# Patient Record
Sex: Female | Born: 1965 | Race: White | Hispanic: No | Marital: Married | Smoking: Never smoker
Health system: Southern US, Community
[De-identification: ages and names within clinical notes are randomized; demographics above are authoritative.]

## PROBLEM LIST (undated history)

## (undated) HISTORY — PX: ENDOMETRIAL ABLATION: SHX621

## (undated) HISTORY — PX: TUBAL LIGATION: SHX77

---

## 1999-01-19 ENCOUNTER — Inpatient Hospital Stay (HOSPITAL_COMMUNITY): Admission: AD | Admit: 1999-01-19 | Discharge: 1999-01-19 | Payer: Self-pay | Admitting: *Deleted

## 1999-05-28 ENCOUNTER — Other Ambulatory Visit: Admission: RE | Admit: 1999-05-28 | Discharge: 1999-05-28 | Payer: Self-pay | Admitting: Obstetrics and Gynecology

## 2007-06-26 ENCOUNTER — Other Ambulatory Visit: Admission: RE | Admit: 2007-06-26 | Discharge: 2007-06-26 | Payer: Self-pay | Admitting: Obstetrics & Gynecology

## 2007-08-31 ENCOUNTER — Ambulatory Visit (HOSPITAL_BASED_OUTPATIENT_CLINIC_OR_DEPARTMENT_OTHER): Admission: RE | Admit: 2007-08-31 | Discharge: 2007-08-31 | Payer: Self-pay | Admitting: Obstetrics & Gynecology

## 2008-05-03 ENCOUNTER — Ambulatory Visit (HOSPITAL_COMMUNITY): Admission: RE | Admit: 2008-05-03 | Discharge: 2008-05-03 | Payer: Self-pay | Admitting: Obstetrics & Gynecology

## 2010-08-23 ENCOUNTER — Other Ambulatory Visit: Payer: Self-pay | Admitting: Obstetrics & Gynecology

## 2010-08-23 DIAGNOSIS — Z1231 Encounter for screening mammogram for malignant neoplasm of breast: Secondary | ICD-10-CM

## 2010-09-11 ENCOUNTER — Ambulatory Visit (HOSPITAL_COMMUNITY)
Admission: RE | Admit: 2010-09-11 | Discharge: 2010-09-11 | Disposition: A | Discharge status: Home / Self Care | Payer: BC Managed Care – PPO | Source: Ambulatory Visit | Attending: Obstetrics & Gynecology | Admitting: Obstetrics & Gynecology

## 2010-09-11 DIAGNOSIS — Z1231 Encounter for screening mammogram for malignant neoplasm of breast: Secondary | ICD-10-CM

## 2010-11-06 NOTE — Op Note (Signed)
Dawn Sims              ACCOUNT NO.:  [id number]   MEDICAL RECORD NO.:  [id number]          PATIENT TYPE:  AMB   LOCATION:  [facility]                         FACILITY:  [facility]   PHYSICIAN:  M. Leda Quail, MD  DATE OF BIRTH:  [birth year]   DATE OF PROCEDURE:  08/31/2007  DATE OF DISCHARGE:                               OPERATIVE REPORT   PREOPERATIVE DIAGNOSES:  69. 45 year old G2, P1, ectopic 1 white female with menorrhagia.  2. Endometrial polyps noted on sonohysterogram.  3. Undesired fertility.   POSTOPERATIVE DIAGNOSES:  52. 45 year old G2, P1, ectopic 1 white female with menorrhagia.  2. Endometrial polyps noted on sonohysterogram.  3. Undesired fertility.  4. The pretreatment with Lupron cause regression of the small      endometrial polyps that had been seen on sonohysterogram.   PROCEDURE:  Hydro thermal ablation and laparoscopic bilateral tubal  ligation with Filshie clips.   SURGEON:  M. Leda Quail, MD.   ASSISTANT:  OR staff.   ANESTHESIA:  General endotracheal, Dr. Rica Mast was the anesthesiologist  overseeing the case.   FINDINGS:  1. Thin-appearing endometrium noted with hysteroscopy.  2. Normal uterus, tubes and ovaries as well as normal-appearing liver,      falciform ligament and stomach edge.   SPECIMENS:  None.   ESTIMATED BLOOD LOSS:  Minimal.   FLUIDS:  1000 mL of LR.   URINE OUTPUT:  100 mL of clear urine drained with Foley catheter twice  during the procedure.   COMPLICATIONS:  None.   INDICATIONS:  [name] is a very nice 45 year old G2 P1, ectopic 1  white female who has a history of menorrhagia.  She had been evaluated  with sonohysterogram which showed several small endometrial polyps and  an endometrial biopsy.  The endometrial biopsy was done in January and  showed benign secretory endometrium.  She was pretreated with Depo-  Lupron.  The risk and benefits have been explained in the office and she  is ready to  proceed.   PROCEDURE:  The patient is taken the to operating room.  She is placed  in supine position.  General endotracheal anesthesia was administered by  the anesthesia staff without difficulty.  Legs positioned in [place]  stirrups initially in the high lithotomy position.  Abdomen, perineum,  inner thighs and vagina prepped and draped in normal sterile fashion.  Red rubber Foley catheter was used to drain the bladder of all urine at  this point and the Foley catheter was kept sterile.  Bivalve speculum  was placed in the vagina.  The anterior lip cervix grasped with single-  tooth tenaculum.  Uterus sounds to about 6.5 cm.  Using [employer] dilators,  the cervix was dilated up to #21.  5 mm hysteroscope with the HTA  apparatus attached is obtained.  An attempt was made to pass this  through the endocervical canal.  This was not successful.  Then the  cervix was dilated up to a #23.  At this point the scope with the HTA  apparatus attached could be slowly passed through the endocervical canal  into the  endometrial cavity.  Photo documentation of endometrial cavity  and fallopian tube ostia are noted.  The endometrial is thin and there  was no apparent endometrial polyps.  At this point the scope was placed  in toward the lower uterine segment.  Then a flush of the system was  performed without difficulty.  There appeared to be a great seal around  the cervix.  Then the system filled and heating cycle was begun.  Once  the hysteroscopic fluid was heated to approximately 90 degrees Celsius,  a 10-minute ablation cycle was performed.  The reservoir was stable  around 80 mL.  During the entire procedure there was approximately a 2  to 3 mL loss which is consistent with expansion of the endometrial  cavity during the ablation.  Once a 10-minute ablation was complete, a  cooling cycle was performed and once the fluid was cool enough, the  scope was removed from the endometrial cavity.  Photo  documentation was  performed before this was done.  At this point the scope was removed.  A  acorn uterine manipulator was placed at the cervix and attached to the  tenaculum as a means of manipulating the uterus during the rest of the  procedure.  The bivalve speculum was removed at this point.  Legs are  positioned in the low lithotomy position and the bladder was then  drained of all urine again.   Sterile gown and gloves were changed by the operator and assistant.  Attention was turned to the umbilicus.  0.25% Marcaine was placed  beneath the umbilicus.  Approximately 2 mL of 0.25% Marcaine without  epinephrine was placed.  Of note a paracervical block with 1% lidocaine  was placed in the cervix before the beginning of any of the procedure  vaginally.  Using a #11 blade a 10 mm skin incision was made from the  umbilicus inferiorly.  Using hemostats, subcu fat tissue was dissected.  The fascia was identified using S retractors.  This was grasped with the  Kocher clamps and elevated.  Using curved Mayo scissors, this fascial  layer was incised.  Then Kelly clamp was used to dissect the  preperitoneal fat.  The posterior sheath had already been incised.  The  peritoneum was entered bluntly.  At this point a number 0 Vicryl was  obtained.  Figure-of-eight suture was placed on either side of the  fascial incision and then a Hasson was placed intra-abdominally.  The  Hasson was attached to the skin using the sutures.  Then CO2 gas was  attached to the Hasson and pneumoperitoneum was easily achieved.  Laparoscope was used to visualize the pelvis.  The patient was placed in  Trendelenburg.  The upper abdomen was surveyed with a normal-appearing  liver, stomach edge, falciform ligament.  Appendix could not be  visualized.  The uterus, ovaries, fallopian tubes appeared normal.  There was no scarring evident from the previous ectopic.  This had been  treated with methotrexate.  This point a  clip applier with Filshie clip  was obtained.  A Filshie clip was placed on the right and left fallopian  tube and the isthmic region without difficulty.  Photo documentation was  made of these clips and tubes bilaterally.  The tubes had been carried  out to the fimbriated end to ensure that the clips were placed on the  fallopian tubes and not round ligaments.  At this point the procedure  was ended.  The patient is placed  back in supine position.  The  laparoscope was removed.  The pneumoperitoneum was relieved.  A C.R.N.A.  gave the patient several deep breaths in an attempt to remove all air  from the abdomen.  The Hasson was then removed.  The operator's index  fingers placed in the incision to ensure no bowel was in place.  The  figure-of-eight sutures on each side of the incision were then tied  together to close the fascia.  At this point the incision was closed  with subcuticular stitch of 3-0 Vicryl.  Then a Dermabond was used to  make the skin incision air tight.  The Betadine was cleansed off the  skin.  The instruments from the vagina are removed.  There was only a  small amount bleeding from the tenaculum site on the cervix.  At this  point legs were positioned in the supine position.  They were lowered to  the  low lithotomy position for the laparoscopic portion of the procedure.  The patient was awakened from anesthesia and extubated without  difficulty.  Sponge, lap, needle and sponge counts were correct x2.  The  patient tolerated procedure well and she was taken to the recovery room  in stable condition.      Lum Keas, MD  Electronically Signed     MSM/MEDQ  D:  08/31/2007  T:  09/01/2007  Job:  [id number]

## 2011-03-18 LAB — URINALYSIS, ROUTINE W REFLEX MICROSCOPIC
Glucose, UA: NEGATIVE
Hgb urine dipstick: NEGATIVE
Nitrite: NEGATIVE
pH: 7

## 2011-03-18 LAB — CBC
Hemoglobin: 11.8 — ABNORMAL LOW
MCHC: 33.4
RBC: 4.46
RDW: 14.2

## 2011-10-07 ENCOUNTER — Other Ambulatory Visit: Payer: Self-pay | Admitting: Obstetrics & Gynecology

## 2011-10-07 DIAGNOSIS — Z1231 Encounter for screening mammogram for malignant neoplasm of breast: Secondary | ICD-10-CM

## 2011-11-07 ENCOUNTER — Ambulatory Visit (HOSPITAL_COMMUNITY)
Admission: RE | Admit: 2011-11-07 | Discharge: 2011-11-07 | Disposition: A | Discharge status: Home / Self Care | Payer: BC Managed Care – PPO | Source: Ambulatory Visit | Attending: Obstetrics & Gynecology | Admitting: Obstetrics & Gynecology

## 2011-11-07 DIAGNOSIS — Z1231 Encounter for screening mammogram for malignant neoplasm of breast: Secondary | ICD-10-CM | POA: Insufficient documentation

## 2011-11-12 ENCOUNTER — Other Ambulatory Visit: Payer: Self-pay | Admitting: Obstetrics & Gynecology

## 2011-11-12 DIAGNOSIS — R928 Other abnormal and inconclusive findings on diagnostic imaging of breast: Secondary | ICD-10-CM

## 2011-11-21 ENCOUNTER — Ambulatory Visit
Admission: RE | Admit: 2011-11-21 | Discharge: 2011-11-21 | Disposition: A | Discharge status: Home / Self Care | Payer: BC Managed Care – PPO | Source: Ambulatory Visit | Attending: Obstetrics & Gynecology | Admitting: Obstetrics & Gynecology

## 2011-11-21 DIAGNOSIS — R928 Other abnormal and inconclusive findings on diagnostic imaging of breast: Secondary | ICD-10-CM

## 2012-10-16 ENCOUNTER — Other Ambulatory Visit: Payer: Self-pay

## 2012-10-16 DIAGNOSIS — Z1231 Encounter for screening mammogram for malignant neoplasm of breast: Secondary | ICD-10-CM

## 2012-12-01 ENCOUNTER — Ambulatory Visit
Admission: RE | Admit: 2012-12-01 | Discharge: 2012-12-01 | Disposition: A | Discharge status: Home / Self Care | Payer: BC Managed Care – PPO | Source: Ambulatory Visit

## 2012-12-01 DIAGNOSIS — Z1231 Encounter for screening mammogram for malignant neoplasm of breast: Secondary | ICD-10-CM

## 2012-12-02 ENCOUNTER — Other Ambulatory Visit: Payer: Self-pay | Admitting: Obstetrics & Gynecology

## 2012-12-02 DIAGNOSIS — R928 Other abnormal and inconclusive findings on diagnostic imaging of breast: Secondary | ICD-10-CM

## 2012-12-07 ENCOUNTER — Ambulatory Visit
Admission: RE | Admit: 2012-12-07 | Discharge: 2012-12-07 | Disposition: A | Discharge status: Home / Self Care | Payer: BC Managed Care – PPO | Source: Ambulatory Visit | Attending: Obstetrics & Gynecology | Admitting: Obstetrics & Gynecology

## 2012-12-07 DIAGNOSIS — R928 Other abnormal and inconclusive findings on diagnostic imaging of breast: Secondary | ICD-10-CM

## 2013-03-19 ENCOUNTER — Encounter: Payer: Self-pay | Admitting: Obstetrics & Gynecology

## 2013-08-12 ENCOUNTER — Encounter: Payer: Self-pay | Admitting: Obstetrics & Gynecology

## 2013-08-13 ENCOUNTER — Encounter: Payer: Self-pay | Admitting: Obstetrics & Gynecology

## 2013-08-13 ENCOUNTER — Ambulatory Visit (INDEPENDENT_AMBULATORY_CARE_PROVIDER_SITE_OTHER): Payer: BC Managed Care – PPO | Admitting: Obstetrics & Gynecology

## 2013-08-13 VITALS — BP 118/80 | HR 64 | Resp 16 | Ht 61.0 in | Wt 126.0 lb

## 2013-08-13 DIAGNOSIS — Z0000 Encounter for general adult medical examination without abnormal findings: Secondary | ICD-10-CM

## 2013-08-13 DIAGNOSIS — D242 Benign neoplasm of left breast: Secondary | ICD-10-CM

## 2013-08-13 DIAGNOSIS — D249 Benign neoplasm of unspecified breast: Secondary | ICD-10-CM

## 2013-08-13 DIAGNOSIS — Z01419 Encounter for gynecological examination (general) (routine) without abnormal findings: Secondary | ICD-10-CM

## 2013-08-13 LAB — POCT URINALYSIS DIPSTICK
Bilirubin, UA: NEGATIVE
Blood, UA: NEGATIVE
Glucose, UA: NEGATIVE
Ketones, UA: NEGATIVE
Leukocytes, UA: NEGATIVE
Nitrite, UA: NEGATIVE
Protein, UA: NEGATIVE
Urobilinogen, UA: NEGATIVE
pH, UA: 6

## 2013-08-13 LAB — HEMOGLOBIN, FINGERSTICK: HEMOGLOBIN, FINGERSTICK: 14 g/dL (ref 12.0–16.0)

## 2013-08-13 NOTE — Addendum Note (Signed)
Addended by: Michele Mcalpine on: 08/13/2013 10:30 AM   Modules accepted: Orders

## 2013-08-13 NOTE — Progress Notes (Signed)
48 y.o. G1P1 SingleCaucasianF here for annual exam.  No vaginal bleeding in last year.  Really pleased with ablation.  No vasomotor symptoms.    Hasn't done follow up for MMG.  Will schedule.    Patient's last menstrual period was 04/24/2012.          Sexually active: yes  The current method of family planning is tubal ligation.    Exercising: no  not regularly Smoker:  no  Health Maintenance: Pap:  05/07/12 WNL/negative HR HPV History of abnormal Pap:  Yes age 82 h/o cryosurgery MMG:  12/01/12 3D mmg, 12/07/12 left breast US-patient aware 6 month [place] f/u past due Colonoscopy:  none BMD:   none TDaP:  11/13 Screening Labs: 11/13, Hb today: 14.0, Urine today: PH-6.0   reports that she has never smoked. She has never used smokeless tobacco. She reports that she does not drink alcohol or use illicit drugs.  History reviewed. No pertinent past medical history.  Past Surgical History  Procedure Laterality Date  . Tubal ligation    . Ablation      Current Outpatient Prescriptions  Medication Sig Dispense Refill  . CALCIUM PO Take by mouth daily.      . Omega-3 Fatty Acids (FISH OIL PO) Take by mouth daily.       No current facility-administered medications for this visit.    Family History  Problem Relation Age of Onset  . Bladder Cancer Father   . Deep vein thrombosis Mother     ROS:  Pertinent items are noted in HPI.  Otherwise, a comprehensive ROS was negative.  Exam:   BP 118/80  Pulse 64  Resp 16  Ht 5\' 1"  (1.549 m)  Wt 126 lb (57.153 kg)  BMI 23.82 kg/m2  LMP 04/24/2012  Weight change: +3lbs  Height: 5\' 1"  (154.9 cm)  Ht Readings from Last 3 Encounters:  08/13/13 5\' 1"  (1.549 m)    General appearance: alert, cooperative and appears stated age Head: Normocephalic, without obvious abnormality, atraumatic Neck: no adenopathy, supple, symmetrical, trachea midline and thyroid normal to inspection and palpation Lungs: clear to auscultation bilaterally Breasts:  normal appearance, no masses or tenderness Heart: regular rate and rhythm Abdomen: soft, non-tender; bowel sounds normal; no masses,  no organomegaly Extremities: extremities normal, atraumatic, no cyanosis or edema Skin: Skin color, texture, turgor normal. No rashes or lesions Lymph nodes: Cervical, supraclavicular, and axillary nodes normal. No abnormal inguinal nodes palpated Neurologic: Grossly normal   Pelvic: External genitalia:  no lesions              Urethra:  normal appearing urethra with no masses, tenderness or lesions              Bartholins and Skenes: normal                 Vagina: normal appearing vagina with normal color and discharge, no lesions              Cervix: no lesions              Pap taken: no Bimanual Exam:  Uterus:  normal size, contour, position, consistency, mobility, non-tender              Adnexa: normal adnexa and no mass, fullness, tenderness               Rectovaginal: Confirms               Anus:  normal sphincter tone, no  lesions  A:  Well Woman with normal exam Amenorrhea after endometrial ablation H/o abnormal MMG 6/14.  Didn't go for f/u.  P:   Left breast u/s to be scheduled. pap smear with neg HR HPV 11/3.  No pap today.  Labs 11/13.  No lab work today. return annually or prn  An After Visit Summary was printed and given to the patient.

## 2013-08-13 NOTE — Patient Instructions (Signed)

## 2013-08-13 NOTE — Progress Notes (Signed)
Patient scheduled for [facility] for 08/18/13 at 1615 for L Breast Ultrasound. Agreeable to time/date/location.

## 2013-08-18 ENCOUNTER — Other Ambulatory Visit: Payer: BC Managed Care – PPO

## 2013-08-24 ENCOUNTER — Telehealth: Payer: Self-pay | Admitting: Emergency Medicine

## 2013-08-24 NOTE — Telephone Encounter (Signed)
Patient has cancelled her breast u/s appointment as of 08/16/2013 9:37 AM.   Dawn Sims with patient. She is very thankful for phone call to follow up, but states that she will [facility] as she has had other family issues and weather changes have caused her to make changes to her schedule.   Routing to provider for final review. Patient agreeable to disposition. Will close encounter. Will continue to keep in Mammogram hold.

## 2013-09-16 ENCOUNTER — Telehealth: Payer: Self-pay | Admitting: Obstetrics & Gynecology

## 2013-09-16 NOTE — Telephone Encounter (Signed)
per patient, she is scheduled for annual mammogram in June. Will wait until then.

## 2013-10-01 NOTE — Telephone Encounter (Signed)
Dr. Sabra Heck, I have attempted to contact patient on 08/24/13 and the message was that she would reschedule.  Patient will be 6 months late for 6 month follow up if waits until June for L Breast US.   Do you want to send letter? Keep in hold?

## 2013-10-04 ENCOUNTER — Encounter: Payer: Self-pay | Admitting: Obstetrics & Gynecology

## 2013-10-04 NOTE — Telephone Encounter (Signed)
Letter written.  Will mail and then remove from hold and any recall that is present.

## 2013-10-07 NOTE — Telephone Encounter (Signed)
Late entry for 10/04/13: Letter mailed to patient. Will remove from hold and recall.

## 2013-10-28 ENCOUNTER — Other Ambulatory Visit: Payer: Self-pay | Admitting: Obstetrics & Gynecology

## 2013-10-28 DIAGNOSIS — N6320 Unspecified lump in the left breast, unspecified quadrant: Secondary | ICD-10-CM

## 2013-12-07 ENCOUNTER — Encounter (INDEPENDENT_AMBULATORY_CARE_PROVIDER_SITE_OTHER): Payer: Self-pay

## 2013-12-07 ENCOUNTER — Ambulatory Visit
Admission: RE | Admit: 2013-12-07 | Discharge: 2013-12-07 | Disposition: A | Discharge status: Home / Self Care | Payer: BC Managed Care – PPO | Source: Ambulatory Visit | Attending: Obstetrics & Gynecology | Admitting: Obstetrics & Gynecology

## 2013-12-07 DIAGNOSIS — N6320 Unspecified lump in the left breast, unspecified quadrant: Secondary | ICD-10-CM

## 2013-12-07 DIAGNOSIS — D242 Benign neoplasm of left breast: Secondary | ICD-10-CM

## 2014-04-25 ENCOUNTER — Encounter: Payer: Self-pay | Admitting: Obstetrics & Gynecology

## 2014-10-27 ENCOUNTER — Ambulatory Visit (INDEPENDENT_AMBULATORY_CARE_PROVIDER_SITE_OTHER): Payer: BLUE CROSS/BLUE SHIELD | Admitting: Obstetrics & Gynecology

## 2014-10-27 ENCOUNTER — Encounter: Payer: Self-pay | Admitting: Obstetrics & Gynecology

## 2014-10-27 VITALS — BP 120/72 | HR 68 | Resp 16 | Ht 61.0 in | Wt 129.0 lb

## 2014-10-27 DIAGNOSIS — Z01419 Encounter for gynecological examination (general) (routine) without abnormal findings: Secondary | ICD-10-CM | POA: Diagnosis not present

## 2014-10-27 DIAGNOSIS — Z124 Encounter for screening for malignant neoplasm of cervix: Secondary | ICD-10-CM

## 2014-10-27 DIAGNOSIS — Z0000 Encounter for general adult medical examination without abnormal findings: Secondary | ICD-10-CM | POA: Diagnosis not present

## 2014-10-27 LAB — POCT URINALYSIS DIPSTICK
BILIRUBIN UA: NEGATIVE
GLUCOSE UA: NEGATIVE
Ketones, UA: NEGATIVE
Leukocytes, UA: NEGATIVE
NITRITE UA: NEGATIVE
PROTEIN UA: NEGATIVE
RBC UA: NEGATIVE
UROBILINOGEN UA: NEGATIVE
pH, UA: 5

## 2014-10-27 LAB — LIPID PANEL
Cholesterol: 202 mg/dL — ABNORMAL HIGH (ref 0–200)
HDL: 57 mg/dL (ref >=46)
LDL CALC: 128 mg/dL — AB (ref 0–99)
TRIGLYCERIDES: 86 mg/dL (ref <150)
Total CHOL/HDL Ratio: 3.5 Ratio
VLDL: 17 mg/dL (ref 0–40)

## 2014-10-27 LAB — COMPREHENSIVE METABOLIC PANEL
ALK PHOS: 62 U/L (ref 39–117)
ALT: 13 U/L (ref 0–35)
AST: 16 U/L (ref 0–37)
Albumin: 4.1 g/dL (ref 3.5–5.2)
BILIRUBIN TOTAL: 0.5 mg/dL (ref 0.2–1.2)
BUN: 8 mg/dL (ref 6–23)
CO2: 25 mEq/L (ref 19–32)
CREATININE: 0.69 mg/dL (ref 0.50–1.10)
Calcium: 9.4 mg/dL (ref 8.4–10.5)
Chloride: 101 mEq/L (ref 96–112)
GLUCOSE: 82 mg/dL (ref 70–99)
Potassium: 3.9 mEq/L (ref 3.5–5.3)
Sodium: 138 mEq/L (ref 135–145)
Total Protein: 7.1 g/dL (ref 6.0–8.3)

## 2014-10-27 LAB — TSH: TSH: 1.815 u[IU]/mL (ref 0.350–4.500)

## 2014-10-27 MED ORDER — SULFAMETHOXAZOLE-TRIMETHOPRIM 800-160 MG PO TABS: 1.0000 | ORAL_TABLET | Freq: Two times a day (BID) | ORAL | Status: DC

## 2014-10-27 NOTE — Progress Notes (Signed)
49 y.o. G1P1 SingleCaucasianF here for annual exam.  Doing well.  Has had a really good year.  No vaginal bleeding.  H/O endometrial ablation.    No LMP recorded. Patient has had an ablation.          Sexually active: Yes.    The current method of family planning is tubal ligation.    Exercising: Yes.    mowing grass Smoker:  no  Health Maintenance: Pap:  05/06/13 WNL/negative HR HPV History of abnormal Pap:  Yes years ago MMG:  12/07/13 3D-DIAG/US-normal Colonoscopy:  none BMD:   none TDaP:  11/13 Screening Labs: today, Hb today: 13.3, Urine today: negative   reports that she has never smoked. She has never used smokeless tobacco. She reports that she drinks alcohol. She reports that she does not use illicit drugs.  No past medical history on file.  Past Surgical History  Procedure Laterality Date  . Tubal ligation    . Ablation      Current Outpatient Prescriptions  Medication Sig Dispense Refill  . CALCIUM PO Take by mouth daily.    . Omega-3 Fatty Acids (FISH OIL PO) Take by mouth daily.     No current facility-administered medications for this visit.    Family History  Problem Relation Age of Onset  . Bladder Cancer Father   . Deep vein thrombosis Mother     ROS:  Pertinent items are noted in HPI.  Otherwise, a comprehensive ROS was negative.  Exam:   General appearance: alert, cooperative and appears stated age Head: Normocephalic, without obvious abnormality, atraumatic Neck: no adenopathy, supple, symmetrical, trachea midline and thyroid normal to inspection and palpation Lungs: clear to auscultation bilaterally Breasts: normal appearance, no masses or tenderness Heart: regular rate and rhythm Abdomen: soft, non-tender; bowel sounds normal; no masses,  no organomegaly Extremities: extremities normal, atraumatic, no cyanosis or edema Skin: Skin color, texture, turgor normal. No rashes or lesions Lymph nodes: Cervical, supraclavicular, and axillary nodes  normal. No abnormal inguinal nodes palpated Neurologic: Grossly normal   Pelvic: External genitalia:  Small 3 x 2 mm raised, rough lesion at left superficial edge of gluteal cleft--most consistent with actinic keratosis              Urethra:  normal appearing urethra with no masses, tenderness or lesions              Bartholins and Skenes: normal                 Vagina: normal appearing vagina with normal color and discharge, no lesions              Cervix: no lesions              Pap taken: Yes.   Bimanual Exam:  Uterus:  normal size, contour, position, consistency, mobility, non-tender              Adnexa: normal adnexa and no mass, fullness, tenderness               Rectovaginal: Confirms               Anus:  normal sphincter tone, no lesions  Chaperone was present for exam.  A:  Well Woman with normal exam Amenorrhea after endometrial ablation about five years ago Requests rx for antibiotics for traveling for UTI 3 x 2 mm lesions upper left gluteal cleft.  Will monitor for now and if changes/enlarged will plan to remove.  Pt  very comfortable with plan.  P: Mammogram yearly.  Did have abnormal and missed  pap smear with neg HR HPV 11/3. Pap today. Labs today--CMP, TSH, Vit D, Lipids Bactrim DS bid x 3 days--rx to pharmacy Pt consents to colonoscopy next year.  Declines this year.   return annually or prn

## 2014-10-28 LAB — VITAMIN D 25 HYDROXY (VIT D DEFICIENCY, FRACTURES): Vit D, 25-Hydroxy: 20 ng/mL — ABNORMAL LOW (ref 30–100)

## 2014-10-31 ENCOUNTER — Telehealth: Payer: Self-pay

## 2014-10-31 NOTE — Telephone Encounter (Signed)
-----   Message from Megan Salon, MD sent at 10/28/2014  8:14 AM EDT ----- Please inform Cholesterol is fine.  LDL mildly elevated but HDLs good and triglycerides are good.  Nothing needs to be done now with this, just watch.  Repeat 1-2 years, fasting.  She should always do fasting lipids from now on.  CMP normal.  TSH normal.  Vit D is 20.  She needs to be taking 2000 IU daily.  Repeat 1 year.

## 2014-10-31 NOTE — Telephone Encounter (Signed)
Lmtcb//kn 

## 2014-11-01 LAB — IPS PAP TEST WITH REFLEX TO HPV

## 2014-11-01 LAB — HEMOGLOBIN, FINGERSTICK: Hemoglobin, fingerstick: 13.3 g/dL (ref 12.0–16.0)

## 2014-11-03 NOTE — Telephone Encounter (Signed)
Spoke with patient. Advised of results as seen below. Patient is agreeable and verbalizes understanding.   Routing to provider for final review. Patient agreeable to disposition. Patient aware provider will review message and nurse will return call with any additional instructions or change of disposition. Will close encounter.

## 2014-11-30 ENCOUNTER — Other Ambulatory Visit: Payer: Self-pay

## 2014-11-30 DIAGNOSIS — Z1231 Encounter for screening mammogram for malignant neoplasm of breast: Secondary | ICD-10-CM

## 2014-12-30 ENCOUNTER — Other Ambulatory Visit (HOSPITAL_COMMUNITY): Payer: Self-pay

## 2014-12-30 DIAGNOSIS — Z1231 Encounter for screening mammogram for malignant neoplasm of breast: Secondary | ICD-10-CM

## 2015-01-09 ENCOUNTER — Ambulatory Visit: Payer: Self-pay

## 2015-01-12 ENCOUNTER — Ambulatory Visit (HOSPITAL_COMMUNITY): Payer: Self-pay

## 2015-01-13 ENCOUNTER — Telehealth: Payer: Self-pay | Admitting: Obstetrics & Gynecology

## 2015-01-13 NOTE — Telephone Encounter (Signed)
Patient called and left a message on our after-hours voicemail requesting to speak with Dawn Sims. She did not give a reason why. Routing to triage per protocol.

## 2015-01-13 NOTE — Telephone Encounter (Signed)
Spoke with patient. Patient states that she would like to get scheduled to see a GI doctor for a screening colonoscopy. Patient states she has a provider in mind she would like to see. Denies any current problems. Advised as long as she does not have any current problems she can call their facility to set up a new patient appointment to discuss scheduling a colonoscopy. Patient is agreeable and will call to schedule. Will return call to our office if she has any trouble.  Routing to provider for final review. Patient agreeable to disposition. Will close encounter.   Patient aware provider will review message and nurse will return call if any additional advice or change of disposition.

## 2015-02-14 ENCOUNTER — Ambulatory Visit (HOSPITAL_COMMUNITY)
Admission: RE | Admit: 2015-02-14 | Discharge: 2015-02-14 | Disposition: A | Discharge status: Home / Self Care | Payer: BLUE CROSS/BLUE SHIELD | Source: Ambulatory Visit | Attending: Obstetrics & Gynecology | Admitting: Obstetrics & Gynecology

## 2015-02-14 ENCOUNTER — Other Ambulatory Visit: Payer: Self-pay | Admitting: Obstetrics & Gynecology

## 2015-02-14 DIAGNOSIS — Z1231 Encounter for screening mammogram for malignant neoplasm of breast: Secondary | ICD-10-CM | POA: Insufficient documentation

## 2015-11-24 ENCOUNTER — Telehealth: Payer: Self-pay | Admitting: Obstetrics & Gynecology

## 2015-11-24 NOTE — Telephone Encounter (Signed)
Attempted to reach patient x3 at number provided [phone]. Line is busy.

## 2015-11-24 NOTE — Telephone Encounter (Signed)
Patient is asking to talk with Dr.Miller's nurse about possibly getting labs.

## 2015-11-28 NOTE — Telephone Encounter (Signed)
Spoke with patient. Patient states that she is losing her hair and was wanting to have labs performed for evaluation. Reports she has had her lab work done with another provider. She will return call to office if she needs any further assistance.  Routing to provider for final review. Patient agreeable to disposition. Will close encounter.

## 2016-02-14 ENCOUNTER — Other Ambulatory Visit: Payer: Self-pay | Admitting: Obstetrics & Gynecology

## 2016-02-14 DIAGNOSIS — Z1231 Encounter for screening mammogram for malignant neoplasm of breast: Secondary | ICD-10-CM

## 2016-02-21 ENCOUNTER — Ambulatory Visit: Payer: BLUE CROSS/BLUE SHIELD

## 2016-03-28 ENCOUNTER — Encounter: Payer: Self-pay | Admitting: Obstetrics & Gynecology

## 2016-03-28 ENCOUNTER — Ambulatory Visit (INDEPENDENT_AMBULATORY_CARE_PROVIDER_SITE_OTHER): Payer: BLUE CROSS/BLUE SHIELD | Admitting: Obstetrics & Gynecology

## 2016-03-28 VITALS — BP 108/80 | HR 82 | Resp 12 | Ht 61.25 in | Wt 131.4 lb

## 2016-03-28 DIAGNOSIS — Z0000 Encounter for general adult medical examination without abnormal findings: Secondary | ICD-10-CM

## 2016-03-28 DIAGNOSIS — Z01419 Encounter for gynecological examination (general) (routine) without abnormal findings: Secondary | ICD-10-CM | POA: Diagnosis not present

## 2016-03-28 LAB — POCT URINALYSIS DIPSTICK
BILIRUBIN UA: NEGATIVE
Blood, UA: NEGATIVE
Glucose, UA: NEGATIVE
KETONES UA: NEGATIVE
Leukocytes, UA: NEGATIVE
Nitrite, UA: NEGATIVE
PH UA: 7
PROTEIN UA: NEGATIVE
Urobilinogen, UA: NEGATIVE

## 2016-03-28 NOTE — Progress Notes (Signed)
50 y.o. G1P1 Single Caucasian F here for annual exam.  Doing well.  No vaginal bleeding.  Blood work done earlier this year due to hair loss.  Blood work was all normal.  Pt declines having blood work today.  No LMP recorded. Patient has had an ablation.          Sexually active: Yes.    The current method of family planning is ablation.    Exercising: No.  The patient does not participate in regular exercise at present. Smoker:  no  Health Maintenance: Pap:  10/27/14 negative, neg HR HPV 11/14 History of abnormal Pap:  no MMG:  02/21/15 BIRADS 1 negative  Colonoscopy:  never BMD:   never TDaP:  05/07/12 Pneumonia vaccine(s):  never Zostavax:   never Hep C testing: not indicated  Screening Labs: done recently, Hb today: done recently, Urine today: normal    reports that she has never smoked. She has never used smokeless tobacco. She reports that she drinks alcohol. She reports that she does not use drugs.  No past medical history on file.  Past Surgical History:  Procedure Laterality Date  . ENDOMETRIAL ABLATION    . TUBAL LIGATION      Family History  Problem Relation Age of Onset  . Bladder Cancer Father   . Deep vein thrombosis Mother     ROS:  Pertinent items are noted in HPI.  Otherwise, a comprehensive ROS was negative.  Exam:   Vitals:   03/28/16 0849  BP: 108/80  Pulse: 82  Resp: 12    General appearance: alert, cooperative and appears stated age Head: Normocephalic, without obvious abnormality, atraumatic Neck: no adenopathy, supple, symmetrical, trachea midline and thyroid normal to inspection and palpation Lungs: clear to auscultation bilaterally Breasts: normal appearance, no masses or tenderness Heart: regular rate and rhythm Abdomen: soft, non-tender; bowel sounds normal; no masses,  no organomegaly Extremities: extremities normal, atraumatic, no cyanosis or edema Skin: Skin color, texture, turgor normal. No rashes or lesions Lymph nodes: Cervical,  supraclavicular, and axillary nodes normal. No abnormal inguinal nodes palpated Neurologic: Grossly normal  Pelvic: External genitalia:  no lesions              Urethra:  normal appearing urethra with no masses, tenderness or lesions              Bartholins and Skenes: normal                 Vagina: normal appearing vagina with normal color and discharge, no lesions              Cervix: no lesions              Pap taken: No. Bimanual Exam:  Uterus:  normal size, contour, position, consistency, mobility, non-tender              Adnexa: normal adnexa and no mass, fullness, tenderness               Rectovaginal: Confirms               Anus:  normal sphincter tone, no lesions  Chaperone was present for exam.  A:     Well Woman with normal exam Amenorrhea after endometrial ablation  P: Mammogram yearly.  Pt aware of guidelines pap smear with neg HR HPV 11/14.  Neg pap 2016.  No pap obtained today Lab work done earlier this year.  Pt reports this was all normal Declines colonoscopy but  states she will do this in January.    Return annually or prn

## 2016-07-08 ENCOUNTER — Telehealth: Payer: Self-pay | Admitting: *Deleted

## 2016-07-08 DIAGNOSIS — Z1211 Encounter for screening for malignant neoplasm of colon: Secondary | ICD-10-CM

## 2016-07-08 NOTE — Telephone Encounter (Signed)
-----   Message from Megan Salon, MD sent at 07/08/2016  6:25 AM EST ----- Regarding: needs colonoscopy referral When pt was seen, she could not schedule a colonoscopy but asked that we call in January to do referral.  I don't know where she would want to go.  Dr. Carlean Purl at [facility] or Dr. Collene Mares would be my suggestions.  Thanks.  Make phone note please.  MSM

## 2016-07-08 NOTE — Telephone Encounter (Signed)
Left message to call Brennyn Haisley at [phone].  

## 2016-07-12 NOTE — Telephone Encounter (Signed)
Spoke with patient. Advised as seen below per Dr. Sabra Heck. Patient states she would like to see Dr. Collene Mares for colonoscopy. Advised patient referral placed, our referral department will f/u for scheduling. Patient verbalizes understanding and is agreeable.  Routing to provider for final review. Patient is agreeable to disposition. Will close encounter.  Cc: Dawn Sims

## 2016-09-30 ENCOUNTER — Ambulatory Visit: Payer: BLUE CROSS/BLUE SHIELD

## 2017-03-17 ENCOUNTER — Ambulatory Visit: Payer: BLUE CROSS/BLUE SHIELD

## 2017-03-17 ENCOUNTER — Ambulatory Visit
Admission: RE | Admit: 2017-03-17 | Discharge: 2017-03-17 | Disposition: A | Discharge status: Home / Self Care | Payer: BLUE CROSS/BLUE SHIELD | Source: Ambulatory Visit | Attending: Obstetrics & Gynecology | Admitting: Obstetrics & Gynecology

## 2017-03-17 DIAGNOSIS — Z1231 Encounter for screening mammogram for malignant neoplasm of breast: Secondary | ICD-10-CM

## 2017-07-18 ENCOUNTER — Ambulatory Visit (INDEPENDENT_AMBULATORY_CARE_PROVIDER_SITE_OTHER): Payer: BLUE CROSS/BLUE SHIELD | Admitting: Obstetrics & Gynecology

## 2017-07-18 ENCOUNTER — Other Ambulatory Visit: Payer: Self-pay

## 2017-07-18 ENCOUNTER — Encounter: Payer: Self-pay | Admitting: Obstetrics & Gynecology

## 2017-07-18 ENCOUNTER — Other Ambulatory Visit (HOSPITAL_COMMUNITY)
Admission: RE | Admit: 2017-07-18 | Discharge: 2017-07-18 | Disposition: A | Discharge status: Home / Self Care | Payer: BLUE CROSS/BLUE SHIELD | Source: Ambulatory Visit | Attending: Obstetrics & Gynecology | Admitting: Obstetrics & Gynecology

## 2017-07-18 VITALS — BP 118/76 | HR 76 | Resp 14 | Ht 60.75 in | Wt 132.5 lb

## 2017-07-18 DIAGNOSIS — Z124 Encounter for screening for malignant neoplasm of cervix: Secondary | ICD-10-CM | POA: Diagnosis not present

## 2017-07-18 DIAGNOSIS — Z0000 Encounter for general adult medical examination without abnormal findings: Secondary | ICD-10-CM

## 2017-07-18 DIAGNOSIS — Z01419 Encounter for gynecological examination (general) (routine) without abnormal findings: Secondary | ICD-10-CM | POA: Diagnosis not present

## 2017-07-18 NOTE — Progress Notes (Signed)
52 y.o. G1P1 MarriedCaucasianF here for annual exam.  Doing well.  Frustrated with weigh although this has changed very little.  Abdomen shape has changed.  Denies vaginal bleeding.   No LMP recorded. Patient has had an ablation.          Sexually active: Yes.    The current method of family planning is ablation.    Exercising: No.  The patient does not participate in regular exercise at present. Smoker:  no   Health Maintenance: Pap:  10/27/14 negative, 05/07/12 negative, HR HPV negative  History of abnormal Pap:  yes MMG:  03/17/17 BIRADS 1 negative  Colonoscopy:  None  BMD:   None  TDaP:  05/07/12  Pneumonia vaccine(s):  no Shingrix:   no Hep C testing: not indicated  Screening Labs: discuss with provider- patient is fasting, Hb today: same, Urine today: not collected    reports that  has never smoked. she has never used smokeless tobacco. She reports that she drinks alcohol. She reports that she does not use drugs.  History reviewed. No pertinent past medical history.  Past Surgical History:  Procedure Laterality Date  . ENDOMETRIAL ABLATION    . TUBAL LIGATION      Current Outpatient Medications  Medication Sig Dispense Refill  . CALCIUM PO Take by mouth daily.    . clobetasol (OLUX) 0.05 % topical foam     . Omega-3 Fatty Acids (FISH OIL PO) Take by mouth daily.     No current facility-administered medications for this visit.     Family History  Problem Relation Age of Onset  . Bladder Cancer Father   . Deep vein thrombosis Mother   . Breast cancer Neg Hx     ROS:  Pertinent items are noted in HPI.  Otherwise, a comprehensive ROS was negative.  Exam:   BP 118/76 (BP Location: Right Arm, Patient Position: Sitting, Cuff Size: Normal)   Pulse 76   Resp 14   Ht 5' 0.75" (1.543 m)   Wt 132 lb 8 oz (60.1 kg)   BMI 25.24 kg/m   Weight change: +1#   Height: 5' 0.75" (154.3 cm)  Ht Readings from Last 3 Encounters:  07/18/17 5' 0.75" (1.543 m)  03/28/16 5' 1.25"  (1.556 m)  10/27/14 5\' 1"  (1.549 m)    General appearance: alert, cooperative and appears stated age Head: Normocephalic, without obvious abnormality, atraumatic Neck: no adenopathy, supple, symmetrical, trachea midline and thyroid normal to inspection and palpation Lungs: clear to auscultation bilaterally Breasts: normal appearance, no masses or tenderness Heart: regular rate and rhythm Abdomen: soft, non-tender; bowel sounds normal; no masses,  no organomegaly Extremities: extremities normal, atraumatic, no cyanosis or edema Skin: Skin color, texture, turgor normal. No rashes or lesions Lymph nodes: Cervical, supraclavicular, and axillary nodes normal. No abnormal inguinal nodes palpated Neurologic: Grossly normal   Pelvic: External genitalia:  no lesions              Urethra:  normal appearing urethra with no masses, tenderness or lesions              Bartholins and Skenes: normal                 Vagina: normal appearing vagina with normal color and discharge, no lesions              Cervix: no lesions              Pap taken: Yes.   Bimanual  Exam:  Uterus:  normal size, contour, position, consistency, mobility, non-tender              Adnexa: normal adnexa and no mass, fullness, tenderness               Rectovaginal: Confirms               Anus:  normal sphincter tone, no lesions  Chaperone was present for exam.  A:  Well Woman with normal exam PMP, no HRT s/p endometrial ablation  P:   Mammogram guidelines reviewed pap smear with HR HPV obtained today CBC, CMP, Lipids TSH, Vit D obtained today Return annually or prn

## 2017-07-19 LAB — COMPREHENSIVE METABOLIC PANEL
A/G RATIO: 1.4 (ref 1.2–2.2)
ALBUMIN: 4.3 g/dL (ref 3.5–5.5)
ALT: 15 IU/L (ref 0–32)
AST: 18 IU/L (ref 0–40)
Alkaline Phosphatase: 87 IU/L (ref 39–117)
BILIRUBIN TOTAL: 0.3 mg/dL (ref 0.0–1.2)
BUN / CREAT RATIO: 11 (ref 9–23)
BUN: 7 mg/dL (ref 6–24)
CALCIUM: 9.8 mg/dL (ref 8.7–10.2)
CO2: 27 mmol/L (ref 20–29)
Chloride: 101 mmol/L (ref 96–106)
Creatinine, Ser: 0.66 mg/dL (ref 0.57–1.00)
GFR, EST AFRICAN AMERICAN: 117 mL/min/{1.73_m2} (ref >59)
GFR, EST NON AFRICAN AMERICAN: 101 mL/min/{1.73_m2} (ref >59)
GLOBULIN, TOTAL: 3.1 g/dL (ref 1.5–4.5)
Glucose: 90 mg/dL (ref 65–99)
POTASSIUM: 4.5 mmol/L (ref 3.5–5.2)
SODIUM: 142 mmol/L (ref 134–144)
TOTAL PROTEIN: 7.4 g/dL (ref 6.0–8.5)

## 2017-07-19 LAB — CBC
HEMATOCRIT: 39.7 % (ref 34.0–46.6)
HEMOGLOBIN: 13 g/dL (ref 11.1–15.9)
MCH: 29.2 pg (ref 26.6–33.0)
MCHC: 32.7 g/dL (ref 31.5–35.7)
MCV: 89 fL (ref 79–97)
Platelets: 362 10*3/uL (ref 150–379)
RBC: 4.45 x10E6/uL (ref 3.77–5.28)
RDW: 13 % (ref 12.3–15.4)
WBC: 5.6 10*3/uL (ref 3.4–10.8)

## 2017-07-19 LAB — TSH: TSH: 2.23 u[IU]/mL (ref 0.450–4.500)

## 2017-07-19 LAB — LIPID PANEL
CHOL/HDL RATIO: 3.7 ratio (ref 0.0–4.4)
Cholesterol, Total: 234 mg/dL — ABNORMAL HIGH (ref 100–199)
HDL: 63 mg/dL (ref >39)
LDL Calculated: 156 mg/dL — ABNORMAL HIGH (ref 0–99)
Triglycerides: 74 mg/dL (ref 0–149)
VLDL Cholesterol Cal: 15 mg/dL (ref 5–40)

## 2017-07-19 LAB — VITAMIN D 25 HYDROXY (VIT D DEFICIENCY, FRACTURES): VIT D 25 HYDROXY: 19.3 ng/mL — AB (ref 30.0–100.0)

## 2017-07-22 LAB — CYTOLOGY - PAP
DIAGNOSIS: NEGATIVE
HPV (WINDOPATH): NOT DETECTED

## 2017-07-23 ENCOUNTER — Other Ambulatory Visit: Payer: Self-pay | Admitting: *Deleted

## 2017-07-23 ENCOUNTER — Other Ambulatory Visit: Payer: Self-pay | Admitting: Obstetrics & Gynecology

## 2017-07-23 DIAGNOSIS — E7800 Pure hypercholesterolemia, unspecified: Secondary | ICD-10-CM

## 2017-07-23 DIAGNOSIS — R7989 Other specified abnormal findings of blood chemistry: Secondary | ICD-10-CM

## 2017-07-23 MED ORDER — VITAMIN D (ERGOCALCIFEROL) 1.25 MG (50000 UNIT) PO CAPS: 50000.0000 [IU] | ORAL_CAPSULE | ORAL | 0 refills | Status: DC

## 2017-08-06 ENCOUNTER — Other Ambulatory Visit: Payer: Self-pay | Admitting: Obstetrics & Gynecology

## 2017-08-06 DIAGNOSIS — E7800 Pure hypercholesterolemia, unspecified: Secondary | ICD-10-CM

## 2017-10-15 ENCOUNTER — Other Ambulatory Visit: Payer: BLUE CROSS/BLUE SHIELD

## 2017-10-15 DIAGNOSIS — R7989 Other specified abnormal findings of blood chemistry: Secondary | ICD-10-CM

## 2017-10-16 LAB — VITAMIN D 25 HYDROXY (VIT D DEFICIENCY, FRACTURES): VIT D 25 HYDROXY: 50.1 ng/mL (ref 30.0–100.0)

## 2017-10-21 ENCOUNTER — Other Ambulatory Visit: Payer: BLUE CROSS/BLUE SHIELD

## 2018-02-03 ENCOUNTER — Encounter: Payer: Self-pay | Admitting: Obstetrics & Gynecology

## 2018-02-03 DIAGNOSIS — E559 Vitamin D deficiency, unspecified: Secondary | ICD-10-CM | POA: Insufficient documentation

## 2018-10-29 ENCOUNTER — Other Ambulatory Visit: Payer: Self-pay | Admitting: Obstetrics & Gynecology

## 2018-10-29 DIAGNOSIS — Z1231 Encounter for screening mammogram for malignant neoplasm of breast: Secondary | ICD-10-CM

## 2018-11-13 ENCOUNTER — Ambulatory Visit: Payer: BLUE CROSS/BLUE SHIELD | Admitting: Obstetrics & Gynecology

## 2018-11-13 ENCOUNTER — Other Ambulatory Visit: Payer: Self-pay

## 2018-11-18 ENCOUNTER — Encounter: Payer: Self-pay | Admitting: Obstetrics & Gynecology

## 2018-11-18 ENCOUNTER — Other Ambulatory Visit: Payer: Self-pay

## 2018-11-18 ENCOUNTER — Ambulatory Visit (INDEPENDENT_AMBULATORY_CARE_PROVIDER_SITE_OTHER): Payer: BLUE CROSS/BLUE SHIELD | Admitting: Obstetrics & Gynecology

## 2018-11-18 VITALS — BP 112/70 | HR 80 | Temp 97.7°F | Ht 60.75 in | Wt 134.0 lb

## 2018-11-18 DIAGNOSIS — Z01419 Encounter for gynecological examination (general) (routine) without abnormal findings: Secondary | ICD-10-CM | POA: Diagnosis not present

## 2018-11-18 DIAGNOSIS — Z0000 Encounter for general adult medical examination without abnormal findings: Secondary | ICD-10-CM

## 2018-11-18 NOTE — Progress Notes (Signed)
53 y.o. G1P1 Married White or Caucasian female here for annual exam.  Denies vaginal bleeding.    No LMP recorded. Patient has had an ablation.          Sexually active: Yes.    The current method of family planning is ablation.    Exercising: No.   Smoker:  no  Health Maintenance: Pap:  10-27-2014 neg,   07-18-17 neg HPV HR neg History of abnormal Pap:  yes MMG:  03-17-17 BIRADS1:neg. Has appt 12/31/18 Colonoscopy:  None.  Discussed cologuard with pt today. BMD:   none TDaP:  2013 Pneumonia vaccine(s):  n/a Shingrix:   No.  D/w pt shingrix vaccination.   Hep C testing: n/a Screening Labs: Here today - fasting    reports that she has never smoked. She has never used smokeless tobacco. She reports current alcohol use. She reports that she does not use drugs.  History reviewed. No pertinent past medical history.  Past Surgical History:  Procedure Laterality Date  . ENDOMETRIAL ABLATION    . TUBAL LIGATION      No current outpatient medications on file.   No current facility-administered medications for this visit.     Family History  Problem Relation Age of Onset  . Bladder Cancer Father   . Deep vein thrombosis Mother   . Breast cancer Neg Hx     Review of Systems  All other systems reviewed and are negative.   Exam:   BP 112/70   Pulse 80   Temp 97.7 F (36.5 C) (Temporal)   Ht 5' 0.75" (1.543 m)   Wt 134 lb (60.8 kg)   BMI 25.53 kg/m   Height: 5' 0.75" (154.3 cm)  Ht Readings from Last 3 Encounters:  11/18/18 5' 0.75" (1.543 m)  07/18/17 5' 0.75" (1.543 m)  03/28/16 5' 1.25" (1.556 m)    General appearance: alert, cooperative and appears stated age Head: Normocephalic, without obvious abnormality, atraumatic Neck: no adenopathy, supple, symmetrical, trachea midline and thyroid normal to inspection and palpation Lungs: clear to auscultation bilaterally Breasts: normal appearance, no masses or tenderness Heart: regular rate and rhythm Abdomen: soft,  non-tender; bowel sounds normal; no masses,  no organomegaly Extremities: extremities normal, atraumatic, no cyanosis or edema Skin: Skin color, texture, turgor normal. No rashes or lesions Lymph nodes: Cervical, supraclavicular, and axillary nodes normal. No abnormal inguinal nodes palpated Neurologic: Grossly normal   Pelvic: External genitalia:  no lesions              Urethra:  normal appearing urethra with no masses, tenderness or lesions              Bartholins and Skenes: normal                 Vagina: normal appearing vagina with normal color and discharge, no lesions              Cervix: no lesions              Pap taken: No. Bimanual Exam:  Uterus:  normal size, contour, position, consistency, mobility, non-tender              Adnexa: no mass, fullness, tenderness               Rectovaginal: Confirms               Anus:  normal sphincter tone, no lesions  Chaperone was present for exam.  A:  Well Woman with normal  exam PMP, no HRT S/p endometrial ablation Mildly elevated lipids Low Vit D  P:   Mammogram guidelines reviewed.  Has appt scheduled. pap smear with HR HPV neg 2019.  Not indicated today Cologuard order will be done for pt Vit D and Lipids Shingrix vaccine discussed Return annually or prn

## 2018-11-19 LAB — LIPID PANEL
Chol/HDL Ratio: 3.9 ratio (ref 0.0–4.4)
Cholesterol, Total: 204 mg/dL — ABNORMAL HIGH (ref 100–199)
HDL: 52 mg/dL (ref >39)
LDL Calculated: 135 mg/dL — ABNORMAL HIGH (ref 0–99)
Triglycerides: 83 mg/dL (ref 0–149)
VLDL Cholesterol Cal: 17 mg/dL (ref 5–40)

## 2018-11-19 LAB — VITAMIN D 25 HYDROXY (VIT D DEFICIENCY, FRACTURES): Vit D, 25-Hydroxy: 22.2 ng/mL — ABNORMAL LOW (ref 30.0–100.0)

## 2018-11-26 ENCOUNTER — Telehealth: Payer: Self-pay | Admitting: *Deleted

## 2018-11-26 NOTE — Telephone Encounter (Signed)
LM for pt to call back.

## 2018-11-26 NOTE — Telephone Encounter (Signed)
-----   Message from Megan Salon, MD sent at 11/26/2018  7:19 AM EDT ----- Please let pt know her Vit D is low again at 22.  I think she needs to take 5000 IU Vit D daily and just stay on this dosage.  Her cholesterol is mildly elevated at 204 and her LDLs are a little elevated at 135 but better than last year and ok to monitor.  Thanks.  Repeat again at AEX.

## 2018-11-30 NOTE — Telephone Encounter (Signed)
Pt notified. Verbalized understanding. AEX scheduled 03/30/20

## 2018-12-31 ENCOUNTER — Ambulatory Visit
Admission: RE | Admit: 2018-12-31 | Discharge: 2018-12-31 | Disposition: A | Discharge status: Home / Self Care | Payer: BC Managed Care – PPO | Source: Ambulatory Visit | Attending: Obstetrics & Gynecology | Admitting: Obstetrics & Gynecology

## 2018-12-31 DIAGNOSIS — Z1231 Encounter for screening mammogram for malignant neoplasm of breast: Secondary | ICD-10-CM

## 2020-03-30 ENCOUNTER — Ambulatory Visit: Payer: BLUE CROSS/BLUE SHIELD | Admitting: Obstetrics & Gynecology

## 2020-10-31 ENCOUNTER — Other Ambulatory Visit: Payer: Self-pay | Admitting: Physical Medicine and Rehabilitation

## 2020-10-31 DIAGNOSIS — Z1231 Encounter for screening mammogram for malignant neoplasm of breast: Secondary | ICD-10-CM

## 2020-12-28 ENCOUNTER — Ambulatory Visit
Admission: RE | Admit: 2020-12-28 | Discharge: 2020-12-28 | Disposition: A | Discharge status: Home / Self Care | Payer: BC Managed Care – PPO | Source: Ambulatory Visit | Attending: Physical Medicine and Rehabilitation | Admitting: Physical Medicine and Rehabilitation

## 2020-12-28 ENCOUNTER — Other Ambulatory Visit: Payer: Self-pay

## 2020-12-28 DIAGNOSIS — Z1231 Encounter for screening mammogram for malignant neoplasm of breast: Secondary | ICD-10-CM

## 2021-03-27 ENCOUNTER — Ambulatory Visit (HOSPITAL_BASED_OUTPATIENT_CLINIC_OR_DEPARTMENT_OTHER): Payer: BC Managed Care – PPO | Admitting: Obstetrics & Gynecology

## 2021-04-11 ENCOUNTER — Ambulatory Visit (INDEPENDENT_AMBULATORY_CARE_PROVIDER_SITE_OTHER): Payer: BC Managed Care – PPO | Admitting: Obstetrics & Gynecology

## 2021-04-11 ENCOUNTER — Other Ambulatory Visit (HOSPITAL_COMMUNITY)
Admission: RE | Admit: 2021-04-11 | Discharge: 2021-04-11 | Disposition: A | Discharge status: Home / Self Care | Payer: BC Managed Care – PPO | Source: Ambulatory Visit | Attending: Obstetrics & Gynecology | Admitting: Obstetrics & Gynecology

## 2021-04-11 ENCOUNTER — Encounter (HOSPITAL_BASED_OUTPATIENT_CLINIC_OR_DEPARTMENT_OTHER): Payer: Self-pay | Admitting: Obstetrics & Gynecology

## 2021-04-11 ENCOUNTER — Other Ambulatory Visit: Payer: Self-pay

## 2021-04-11 VITALS — BP 130/66 | HR 82 | Ht 60.5 in | Wt 139.2 lb

## 2021-04-11 DIAGNOSIS — Z0000 Encounter for general adult medical examination without abnormal findings: Secondary | ICD-10-CM

## 2021-04-11 DIAGNOSIS — Z1211 Encounter for screening for malignant neoplasm of colon: Secondary | ICD-10-CM

## 2021-04-11 DIAGNOSIS — Z124 Encounter for screening for malignant neoplasm of cervix: Secondary | ICD-10-CM | POA: Insufficient documentation

## 2021-04-11 DIAGNOSIS — Z01419 Encounter for gynecological examination (general) (routine) without abnormal findings: Secondary | ICD-10-CM

## 2021-04-11 NOTE — Progress Notes (Addendum)
55 y.o. G1P1 Married White or Caucasian female here for annual exam.  Doing well.  Denies vaginal bleeding.     No LMP recorded. Patient has had an ablation.          Sexually active: Yes.    The current method of family planning is post menopausal status.    Exercising: No.  Smoker:  no  Health Maintenance: Pap:  07/18/2017 Negative History of abnormal Pap:  remote hx MMG:  12/28/2020 Negative Colonoscopy:  pt is ready for referral BMD:   not indicated yet Screening Labs: will draw today   reports that she has never smoked. She has never used smokeless tobacco. She reports current alcohol use. She reports that she does not use drugs.  No past medical history on file.  Past Surgical History:  Procedure Laterality Date   ENDOMETRIAL ABLATION     TUBAL LIGATION      No current outpatient medications on file.   No current facility-administered medications for this visit.    Family History  Problem Relation Age of Onset   Bladder Cancer Father    Deep vein thrombosis Mother    Breast cancer Neg Hx     Review of Systems  Exam:   BP 130/66 (BP Location: Right Arm, Patient Position: Sitting, Cuff Size: Small)   Pulse 82   Ht 5' 0.5" (1.537 m)   Wt 139 lb 3.2 oz (63.1 kg)   BMI 26.74 kg/m   Height: 5' 0.5" (153.7 cm)  General appearance: alert, cooperative and appears stated age Head: Normocephalic, without obvious abnormality, atraumatic Neck: no adenopathy, supple, symmetrical, trachea midline and thyroid normal to inspection and palpation Lungs: clear to auscultation bilaterally Breasts: normal appearance, no masses or tenderness Heart: regular rate and rhythm Abdomen: soft, non-tender; bowel sounds normal; no masses,  no organomegaly Extremities: extremities normal, atraumatic, no cyanosis or edema Skin: Skin color, texture, turgor normal. No rashes or lesions Lymph nodes: Cervical, supraclavicular, and axillary nodes normal. No abnormal inguinal nodes  palpated Neurologic: Grossly normal   Pelvic: External genitalia:  no lesions              Urethra:  normal appearing urethra with no masses, tenderness or lesions              Bartholins and Skenes: normal                 Vagina: normal appearing vagina with normal color and no discharge, no lesions              Cervix: no lesions              Pap taken: Yes.   Bimanual Exam:  Uterus:  normal size, contour, position, consistency, mobility, non-tender              Adnexa: normal adnexa and no mass, fullness, tenderness               Rectovaginal: Confirms               Anus:  normal sphincter tone, no lesions  Chaperone, Octaviano Batty, CMA, was present for exam.  Assessment/Plan: 1. Well woman exam with routine gynecological exam - pap with HR HPV obtained today - MMG 12/2020 - colonoscopy referral placed - plan BMD closer to age 41 - vaccines reviewed and updated  2.  H/o endometrial ablation  3. Blood tests for routine general physical examination - CBC - Comprehensive metabolic panel - TSH -  Lipid panel

## 2021-04-12 LAB — LIPID PANEL
Chol/HDL Ratio: 4.4 ratio (ref 0.0–4.4)
Cholesterol, Total: 235 mg/dL — ABNORMAL HIGH (ref 100–199)
HDL: 53 mg/dL (ref >39)
LDL Chol Calc (NIH): 161 mg/dL — ABNORMAL HIGH (ref 0–99)
Triglycerides: 116 mg/dL (ref 0–149)
VLDL Cholesterol Cal: 21 mg/dL (ref 5–40)

## 2021-04-12 LAB — COMPREHENSIVE METABOLIC PANEL
ALT: 16 IU/L (ref 0–32)
AST: 21 IU/L (ref 0–40)
Albumin/Globulin Ratio: 1.7 (ref 1.2–2.2)
Albumin: 4.5 g/dL (ref 3.8–4.9)
Alkaline Phosphatase: 91 IU/L (ref 44–121)
BUN/Creatinine Ratio: 8 — ABNORMAL LOW (ref 9–23)
BUN: 6 mg/dL (ref 6–24)
Bilirubin Total: 0.3 mg/dL (ref 0.0–1.2)
CO2: 24 mmol/L (ref 20–29)
Calcium: 9.6 mg/dL (ref 8.7–10.2)
Chloride: 101 mmol/L (ref 96–106)
Creatinine, Ser: 0.72 mg/dL (ref 0.57–1.00)
Globulin, Total: 2.6 g/dL (ref 1.5–4.5)
Glucose: 82 mg/dL (ref 70–99)
Potassium: 4.1 mmol/L (ref 3.5–5.2)
Sodium: 139 mmol/L (ref 134–144)
Total Protein: 7.1 g/dL (ref 6.0–8.5)
eGFR: 98 mL/min/{1.73_m2} (ref >59)

## 2021-04-12 LAB — TSH: TSH: 2.41 u[IU]/mL (ref 0.450–4.500)

## 2021-04-12 LAB — CBC
Hematocrit: 41.4 % (ref 34.0–46.6)
Hemoglobin: 14 g/dL (ref 11.1–15.9)
MCH: 28.9 pg (ref 26.6–33.0)
MCHC: 33.8 g/dL (ref 31.5–35.7)
MCV: 85 fL (ref 79–97)
Platelets: 363 10*3/uL (ref 150–450)
RBC: 4.85 x10E6/uL (ref 3.77–5.28)
RDW: 11.8 % (ref 11.7–15.4)
WBC: 5.6 10*3/uL (ref 3.4–10.8)

## 2021-04-13 LAB — CYTOLOGY - PAP
Comment: NEGATIVE
Diagnosis: NEGATIVE
High risk HPV: NEGATIVE

## 2022-01-23 IMAGING — MG MM DIGITAL SCREENING BILAT W/ TOMO AND CAD
8 series · 8 of 24 positions shown · non-contrast
Comparison: Previous exam(s).

CLINICAL DATA: Screening.

EXAM:
DIGITAL SCREENING BILATERAL MAMMOGRAM WITH TOMOSYNTHESIS AND CAD
TECHNIQUE: Bilateral screening digital craniocaudal and mediolateral oblique
mammograms were obtained. Bilateral screening digital breast
tomosynthesis was performed. The images were evaluated with
computer-aided detection.

[R CC synth-2D]
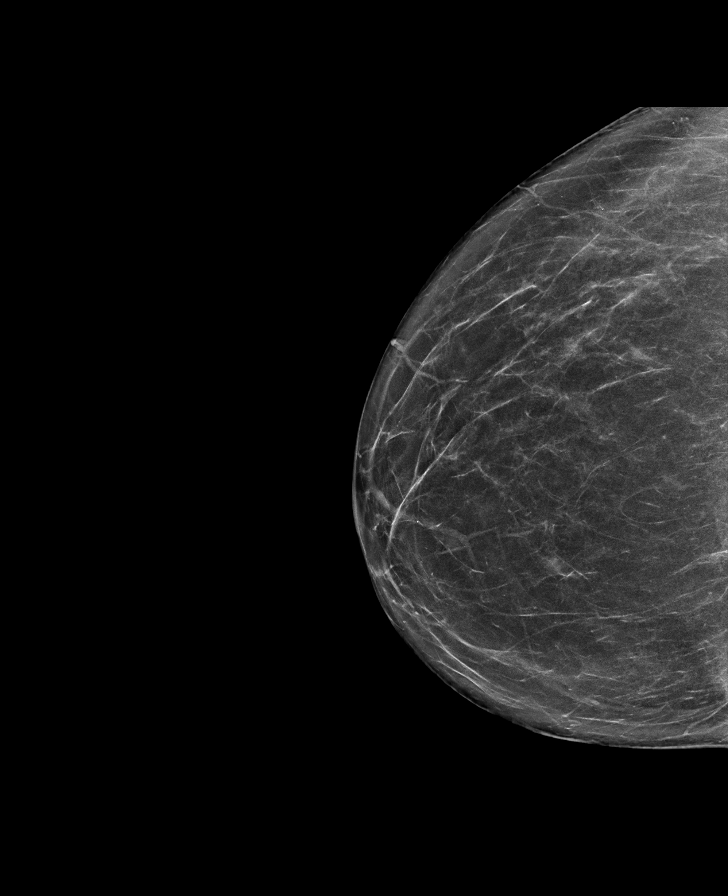

[L MLO synth-2D]
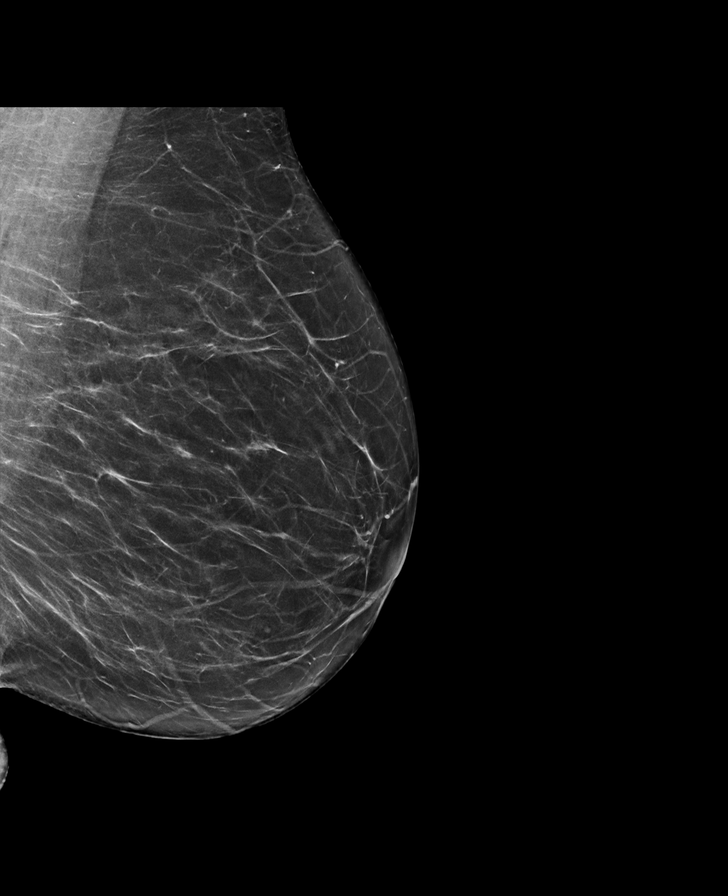

[R MLO synth-2D]
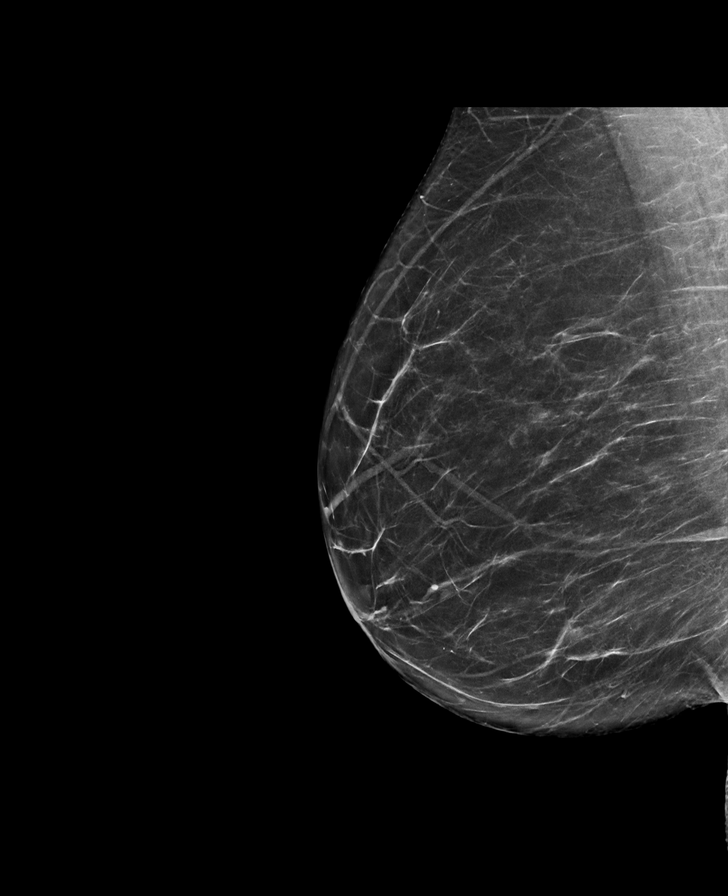

[L CC synth-2D]
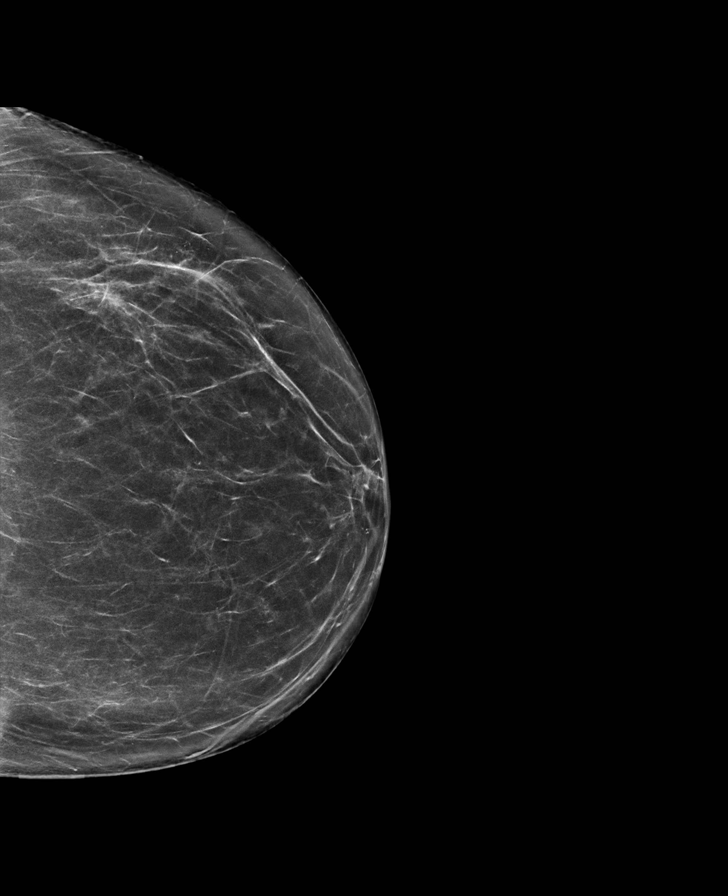

[R MLO tomo · tomo slice 37/74.0]
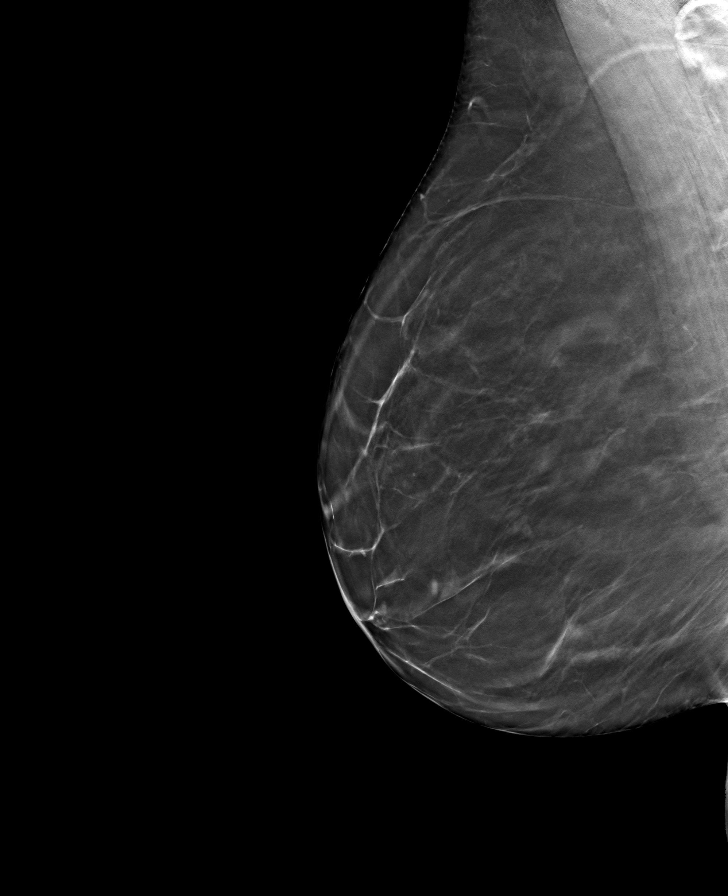

[R CC tomo · tomo slice 36/71.0]
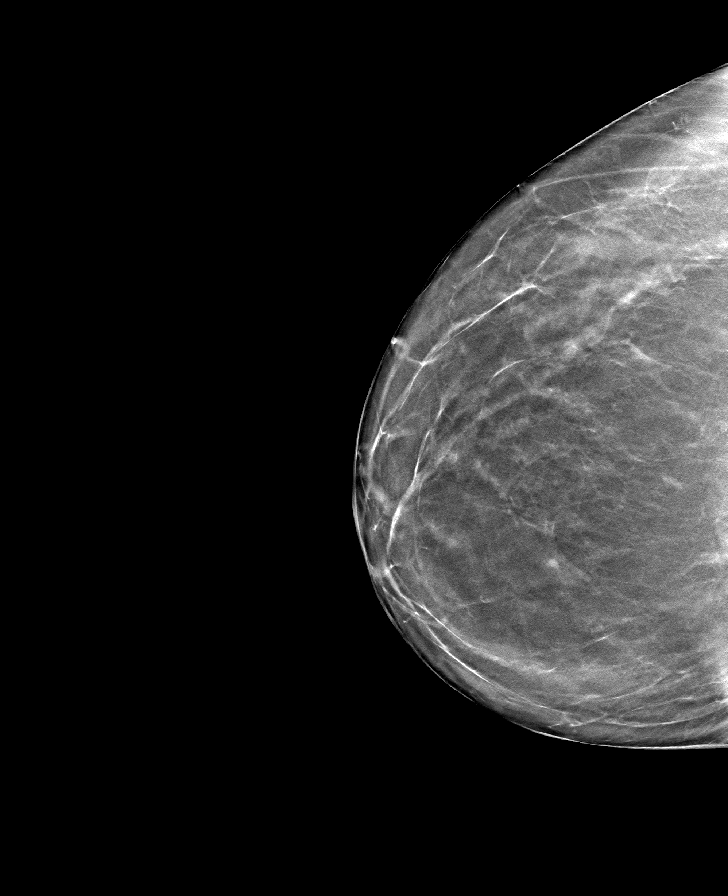

[L CC tomo · tomo slice 35/70.0]
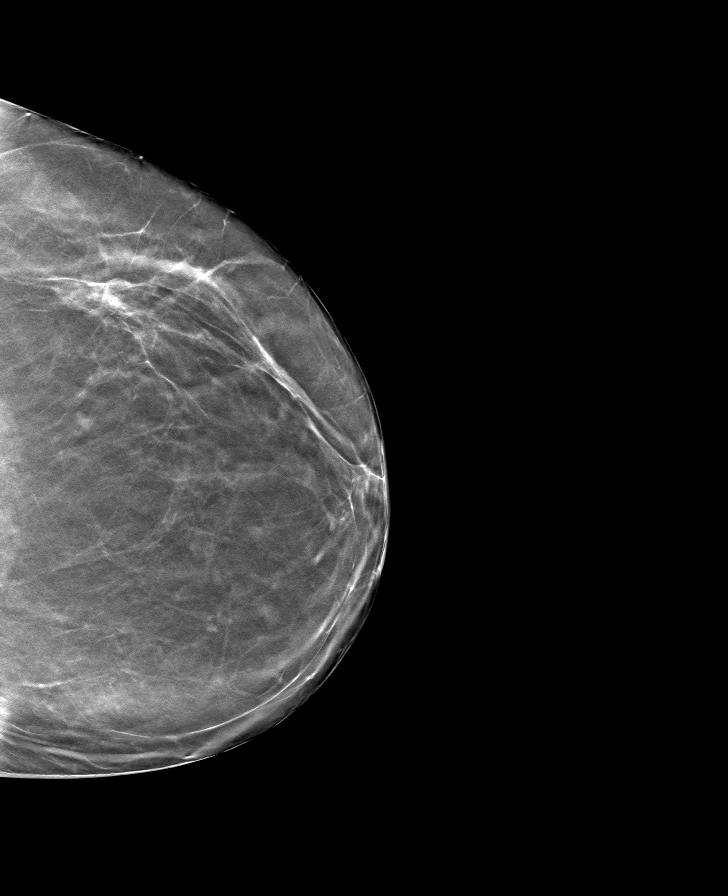

[L MLO tomo · tomo slice 37/73.0]
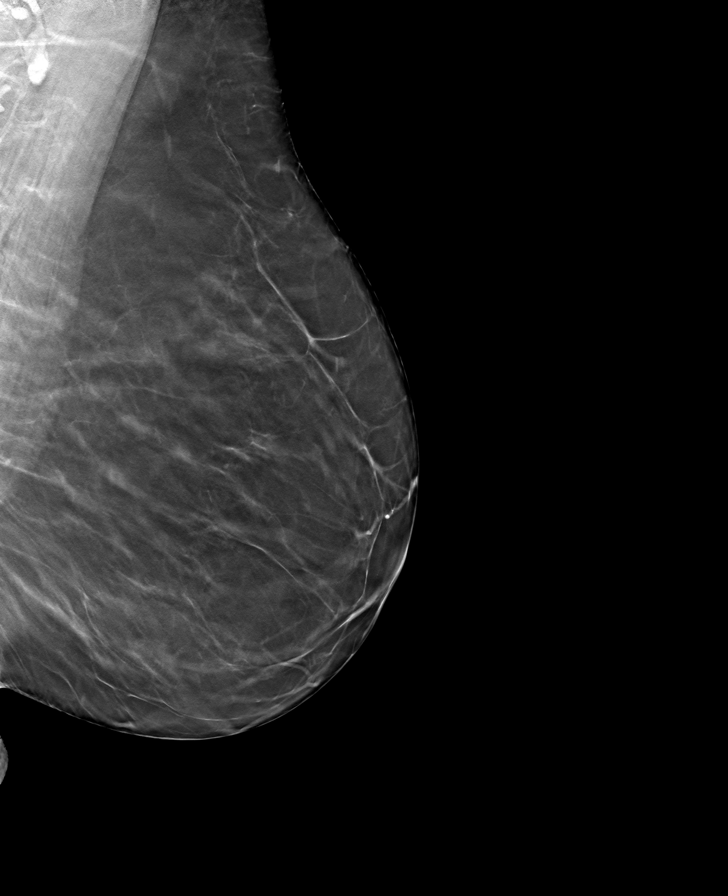

[8 of 24 positions shown; findings below may reference images not displayed]

ACR Breast Density Category b: There are scattered areas of
fibroglandular density.
FINDINGS: There are no findings suspicious for malignancy.
IMPRESSION: No mammographic evidence of malignancy. A result letter of this
screening mammogram will be mailed directly to the patient.

RECOMMENDATION:
Screening mammogram in one year. (Code:51-O-LD2)

BI-RADS CATEGORY  1: Negative.

## 2023-03-10 ENCOUNTER — Other Ambulatory Visit: Payer: Self-pay | Admitting: Certified Registered"

## 2023-03-10 DIAGNOSIS — Z1231 Encounter for screening mammogram for malignant neoplasm of breast: Secondary | ICD-10-CM

## 2023-03-27 ENCOUNTER — Ambulatory Visit
Admission: RE | Admit: 2023-03-27 | Discharge: 2023-03-27 | Disposition: A | Discharge status: Home / Self Care | Payer: BC Managed Care – PPO | Source: Ambulatory Visit | Attending: Certified Registered" | Admitting: Certified Registered"

## 2023-03-27 DIAGNOSIS — Z1231 Encounter for screening mammogram for malignant neoplasm of breast: Secondary | ICD-10-CM

## 2023-04-16 NOTE — Progress Notes (Signed)
57 y.o. G1P1 Married White or Caucasian female here for annual exam.  Doing well.  Denies vaginal bleeding.  Says she is really ready to proceed with colonoscopy this year.  Have done referral in the past but am happy to place again today.  No LMP recorded. Patient has had an ablation.          Sexually active: Yes.    The current method of family planning is post menopausal status.    Smoker:  no  Health Maintenance: Pap:  04/13/2021 Negative History of abnormal Pap:  remote hx MMG:  03/27/2023 Negative Colonoscopy:  ready for referral BMD:   will plan around age 60 Screening Labs: ordered today   reports that she has never smoked. She has never used smokeless tobacco. She reports current alcohol use. She reports that she does not use drugs.  No past medical history on file.  Past Surgical History:  Procedure Laterality Date   ENDOMETRIAL ABLATION     TUBAL LIGATION      No current outpatient medications on file.   No current facility-administered medications for this visit.    Family History  Problem Relation Age of Onset   Bladder Cancer Father    Deep vein thrombosis Mother    Breast cancer Neg Hx     ROS: Constitutional: negative Genitourinary:negative  Exam:   BP 129/69 (BP Location: Right Arm, Patient Position: Sitting, Cuff Size: Normal)   Pulse 78   Ht 5' 0.5" (1.537 m)   Wt 136 lb 9.6 oz (62 kg)   BMI 26.24 kg/m   Height: 5' 0.5" (153.7 cm)  General appearance: alert, cooperative and appears stated age Head: Normocephalic, without obvious abnormality, atraumatic Neck: no adenopathy, supple, symmetrical, trachea midline and thyroid normal to inspection and palpation Lungs: clear to auscultation bilaterally Breasts: normal appearance, no masses or tenderness Heart: regular rate and rhythm Abdomen: soft, non-tender; bowel sounds normal; no masses,  no organomegaly Extremities: extremities normal, atraumatic, no cyanosis or edema Skin: Skin color,  texture, turgor normal. No rashes or lesions Lymph nodes: Cervical, supraclavicular, and axillary nodes normal. No abnormal inguinal nodes palpated Neurologic: Grossly normal   Pelvic: External genitalia:  no lesions              Urethra:  normal appearing urethra with no masses, tenderness or lesions              Bartholins and Skenes: normal                 Vagina: normal appearing vagina with normal color and no discharge, no lesions              Cervix: no lesions              Pap taken: No. Bimanual Exam:  Uterus:  normal size, contour, position, consistency, mobility, non-tender              Adnexa: normal adnexa and no mass, fullness, tenderness               Rectovaginal: Confirms               Anus:  normal sphincter tone, no lesions  Chaperone, Ina Homes, CMA, was present for exam.  Assessment/Plan: 1. Well woman exam with routine gynecological exam - Pap smear neg with neg HR HPV 2022 - Mammogram 03/2023 - Colonoscopy referral placed - Bone mineral density not indicated - lab work ordered today - vaccines reviewed/updated  2.  Colon cancer screening - Ambulatory referral to Gastroenterology  3. Elevated lipids - Hemoglobin A1c - Comprehensive metabolic panel - Lipid panel  4. Postmenopausal  5. Need for Tdap vaccination - Tdap vaccine greater than or equal to 7yo IM

## 2023-04-17 ENCOUNTER — Encounter (HOSPITAL_BASED_OUTPATIENT_CLINIC_OR_DEPARTMENT_OTHER): Payer: Self-pay | Admitting: Obstetrics & Gynecology

## 2023-04-17 ENCOUNTER — Ambulatory Visit (HOSPITAL_BASED_OUTPATIENT_CLINIC_OR_DEPARTMENT_OTHER): Payer: BC Managed Care – PPO | Admitting: Obstetrics & Gynecology

## 2023-04-17 VITALS — BP 129/69 | HR 78 | Ht 60.5 in | Wt 136.6 lb

## 2023-04-17 DIAGNOSIS — Z01419 Encounter for gynecological examination (general) (routine) without abnormal findings: Secondary | ICD-10-CM | POA: Diagnosis not present

## 2023-04-17 DIAGNOSIS — Z780 Asymptomatic menopausal state: Secondary | ICD-10-CM

## 2023-04-17 DIAGNOSIS — Z1211 Encounter for screening for malignant neoplasm of colon: Secondary | ICD-10-CM | POA: Diagnosis not present

## 2023-04-17 DIAGNOSIS — E785 Hyperlipidemia, unspecified: Secondary | ICD-10-CM

## 2023-04-17 DIAGNOSIS — Z23 Encounter for immunization: Secondary | ICD-10-CM

## 2023-04-18 LAB — COMPREHENSIVE METABOLIC PANEL
ALT: 15 [IU]/L (ref 0–32)
AST: 24 [IU]/L (ref 0–40)
Albumin: 4.6 g/dL (ref 3.8–4.9)
Alkaline Phosphatase: 95 [IU]/L (ref 44–121)
BUN/Creatinine Ratio: 12 (ref 9–23)
BUN: 9 mg/dL (ref 6–24)
Bilirubin Total: 0.5 mg/dL (ref 0.0–1.2)
CO2: 25 mmol/L (ref 20–29)
Calcium: 10 mg/dL (ref 8.7–10.2)
Chloride: 97 mmol/L (ref 96–106)
Creatinine, Ser: 0.76 mg/dL (ref 0.57–1.00)
Globulin, Total: 2.6 g/dL (ref 1.5–4.5)
Glucose: 77 mg/dL (ref 70–99)
Potassium: 4.1 mmol/L (ref 3.5–5.2)
Sodium: 139 mmol/L (ref 134–144)
Total Protein: 7.2 g/dL (ref 6.0–8.5)
eGFR: 91 mL/min/{1.73_m2} (ref >59)

## 2023-04-18 LAB — HEMOGLOBIN A1C
Est. average glucose Bld gHb Est-mCnc: 111 mg/dL
Hgb A1c MFr Bld: 5.5 % (ref 4.8–5.6)

## 2023-04-18 LAB — LIPID PANEL
Chol/HDL Ratio: 4.4 ratio (ref 0.0–4.4)
Cholesterol, Total: 248 mg/dL — ABNORMAL HIGH (ref 100–199)
HDL: 57 mg/dL (ref >39)
LDL Chol Calc (NIH): 176 mg/dL — ABNORMAL HIGH (ref 0–99)
Triglycerides: 88 mg/dL (ref 0–149)
VLDL Cholesterol Cal: 15 mg/dL (ref 5–40)

## 2024-07-27 ENCOUNTER — Other Ambulatory Visit: Payer: Self-pay | Admitting: Obstetrics & Gynecology

## 2024-07-27 DIAGNOSIS — Z1231 Encounter for screening mammogram for malignant neoplasm of breast: Secondary | ICD-10-CM

## 2024-08-04 ENCOUNTER — Ambulatory Visit
